# Patient Record
Sex: Male | Born: 1965 | Race: White | Hispanic: No | Marital: Married | Smoking: Former smoker
Health system: Southern US, Community
[De-identification: ages and names within clinical notes are randomized; demographics above are authoritative.]

## PROBLEM LIST (undated history)

## (undated) DIAGNOSIS — E785 Hyperlipidemia, unspecified: Secondary | ICD-10-CM

## (undated) DIAGNOSIS — N419 Inflammatory disease of prostate, unspecified: Secondary | ICD-10-CM

## (undated) HISTORY — DX: Hyperlipidemia, unspecified: E78.5

## (undated) HISTORY — DX: Inflammatory disease of prostate, unspecified: N41.9

---

## 2002-01-21 ENCOUNTER — Other Ambulatory Visit: Admission: RE | Admit: 2002-01-21 | Discharge: 2002-01-21 | Payer: Self-pay | Admitting: Dermatology

## 2007-12-21 ENCOUNTER — Encounter: Admission: RE | Admit: 2007-12-21 | Discharge: 2007-12-21 | Payer: Self-pay | Admitting: Orthopedic Surgery

## 2009-08-30 IMAGING — CR DG HAND 2V*L*
2 series · 2 of 2 positions shown · non-contrast
Comparison: None

CLINICAL DATA: Injured fourth and fifth fingers playing volleyball

LEFT HAND - 2 VIEW

[view not recorded (1 of 2)]
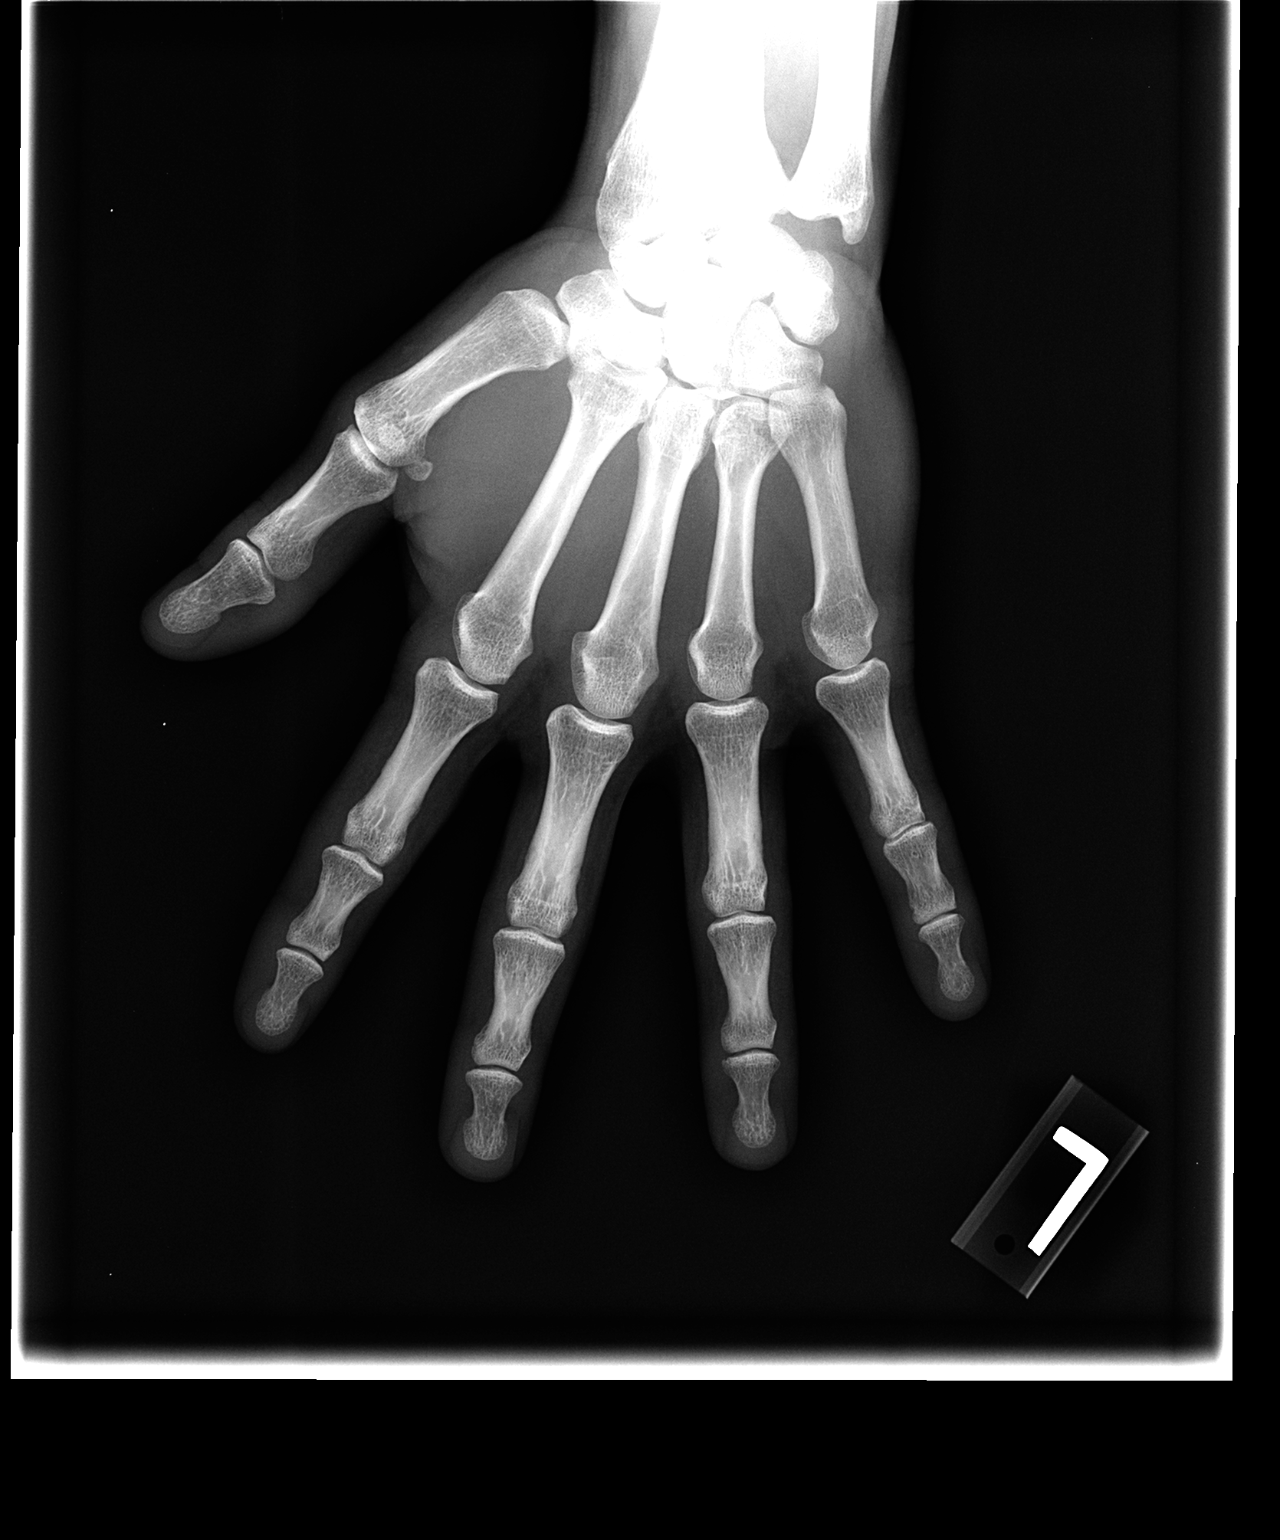

[view not recorded (2 of 2)]
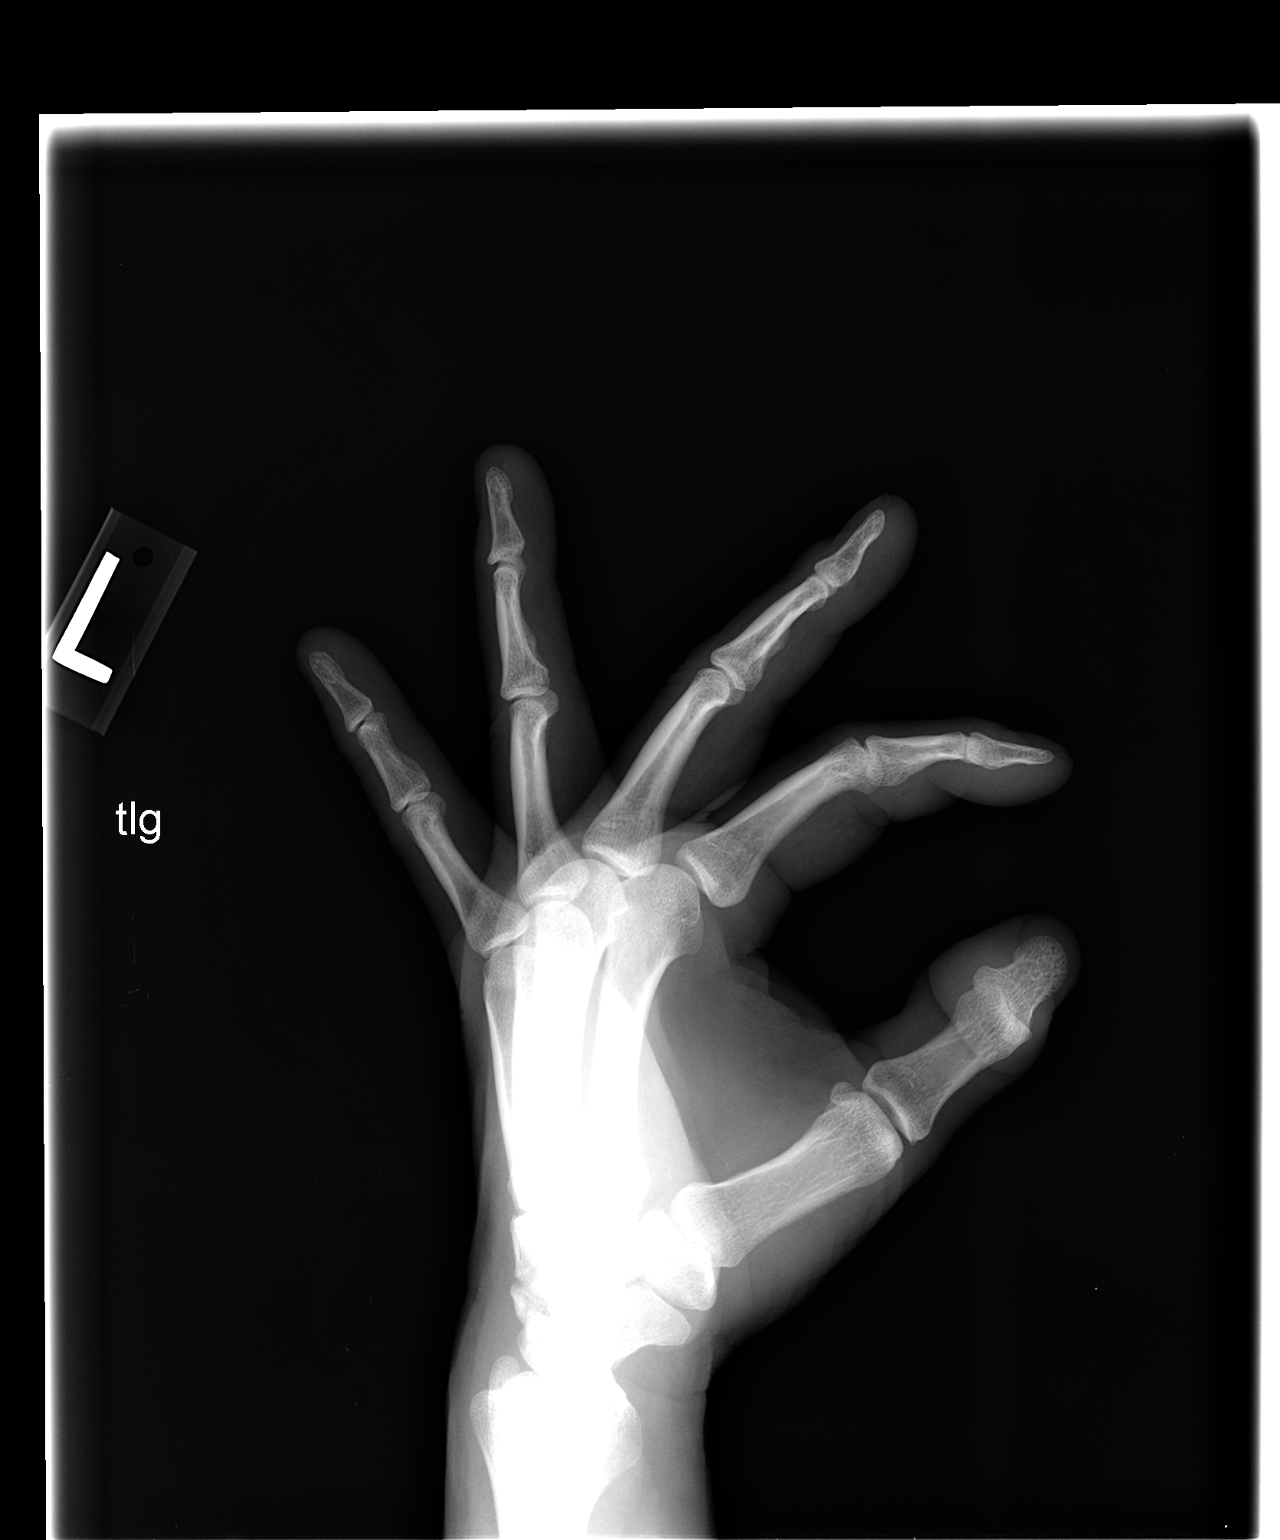

[2 of 2 positions shown; findings below may reference images not displayed]

FINDINGS: Two views of the left hand show no acute fracture.
Alignment is normal.  The radiocarpal joint space is normal and the
carpal bones are in normal position.
IMPRESSION: Negative left hand.

## 2010-11-05 ENCOUNTER — Encounter (INDEPENDENT_AMBULATORY_CARE_PROVIDER_SITE_OTHER): Payer: Managed Care, Other (non HMO)

## 2010-11-05 DIAGNOSIS — M7989 Other specified soft tissue disorders: Secondary | ICD-10-CM

## 2010-11-11 NOTE — Procedures (Unsigned)
DUPLEX DEEP VENOUS EXAM - LOWER EXTREMITY  INDICATION:  Swelling.  HISTORY:  Edema:  Yes. Trauma/Surgery:  Trauma x2 weeks ago. Pain:  Yes. PE:  No. Previous DVT:  No. Anticoagulants:  81 mg x1 a day. Other:  DUPLEX EXAM:               CFV   SFV   PopV  PTV    GSV               R  L  R  L  R  L  R   L  R  L Thrombosis    o  o     o     o      o     o Spontaneous   +  +     +     +      +     + Phasic        +  +     +     +      +     + Augmentation  +  +     +     +      +     + Compressible  +  +     +     +      +     + Competent     +  +     +     +      +     +  Legend:  + - yes  o - no  p - partial  D - decreased  IMPRESSION:  No evidence of acute deep vein thrombosis or superficial thrombophlebitis within the left lower extremity.  Subcutaneous tissue edema within the ankle.   _____________________________ Fransisco Hertz, MD  OD/MEDQ  D:  11/05/2010  T:  11/05/2010  Job:  [id number]

## 2013-04-29 ENCOUNTER — Ambulatory Visit (INDEPENDENT_AMBULATORY_CARE_PROVIDER_SITE_OTHER): Payer: BC Managed Care – PPO | Admitting: Family Medicine

## 2013-04-29 ENCOUNTER — Encounter: Payer: Self-pay | Admitting: Family Medicine

## 2013-04-29 VITALS — BP 134/89 | HR 88 | Temp 99.4°F | Ht 68.0 in | Wt 186.0 lb

## 2013-04-29 DIAGNOSIS — R350 Frequency of micturition: Secondary | ICD-10-CM

## 2013-04-29 DIAGNOSIS — N419 Inflammatory disease of prostate, unspecified: Secondary | ICD-10-CM

## 2013-04-29 LAB — POCT URINALYSIS DIPSTICK
Bilirubin, UA: NEGATIVE
Blood, UA: NEGATIVE
Glucose, UA: NEGATIVE
Ketones, UA: NEGATIVE
Leukocytes, UA: NEGATIVE
Nitrite, UA: NEGATIVE
Protein, UA: NEGATIVE
Spec Grav, UA: 1.01
Urobilinogen, UA: NEGATIVE
pH, UA: 6

## 2013-04-29 LAB — POCT UA - MICROSCOPIC ONLY
Bacteria, U Microscopic: NEGATIVE
Casts, Ur, LPF, POC: NEGATIVE
Crystals, Ur, HPF, POC: NEGATIVE
Epithelial cells, urine per micros: NEGATIVE
Mucus, UA: NEGATIVE
RBC, urine, microscopic: NEGATIVE
WBC, Ur, HPF, POC: NEGATIVE
Yeast, UA: NEGATIVE

## 2013-04-29 MED ORDER — TAMSULOSIN HCL 0.4 MG PO CAPS: 0.4000 mg | ORAL_CAPSULE | Freq: Every day | ORAL | Status: DC

## 2013-04-29 MED ORDER — SULFAMETHOXAZOLE-TMP DS 800-160 MG PO TABS: 1.0000 | ORAL_TABLET | Freq: Two times a day (BID) | ORAL | Status: DC

## 2013-04-29 NOTE — Patient Instructions (Signed)
Urinary Tract Infection  Urinary tract infections (UTIs) can develop anywhere along your urinary tract. Your urinary tract is your body's drainage system for removing wastes and extra water. Your urinary tract includes two kidneys, two ureters, a bladder, and a urethra. Your kidneys are a pair of bean-shaped organs. Each kidney is about the size of your fist. They are located below your ribs, one on each side of your spine.  CAUSES  Infections are caused by microbes, which are microscopic organisms, including fungi, viruses, and bacteria. These organisms are so small that they can only be seen through a microscope. Bacteria are the microbes that most commonly cause UTIs.  SYMPTOMS   Symptoms of UTIs may vary by age and gender of the patient and by the location of the infection. Symptoms in young women typically include a frequent and intense urge to urinate and a painful, burning feeling in the bladder or urethra during urination. Older women and men are more likely to be tired, shaky, and weak and have muscle aches and abdominal pain. A fever may mean the infection is in your kidneys. Other symptoms of a kidney infection include pain in your back or sides below the ribs, nausea, and vomiting.  DIAGNOSIS  To diagnose a UTI, your caregiver will ask you about your symptoms. Your caregiver also will ask to provide a urine sample. The urine sample will be tested for bacteria and white blood cells. White blood cells are made by your body to help fight infection.  TREATMENT   Typically, UTIs can be treated with medication. Because most UTIs are caused by a bacterial infection, they usually can be treated with the use of antibiotics. The choice of antibiotic and length of treatment depend on your symptoms and the type of bacteria causing your infection.  HOME CARE INSTRUCTIONS   If you were prescribed antibiotics, take them exactly as your caregiver instructs you. Finish the medication even if you feel better after you  have only taken some of the medication.   Drink enough water and fluids to keep your urine clear or pale yellow.   Avoid caffeine, tea, and carbonated beverages. They tend to irritate your bladder.   Empty your bladder often. Avoid holding urine for long periods of time.   Empty your bladder before and after sexual intercourse.   After a bowel movement, women should cleanse from front to back. Use each tissue only once.  SEEK MEDICAL CARE IF:    You have back pain.   You develop a fever.   Your symptoms do not begin to resolve within 3 days.  SEEK IMMEDIATE MEDICAL CARE IF:    You have severe back pain or lower abdominal pain.   You develop chills.   You have nausea or vomiting.   You have continued burning or discomfort with urination.  MAKE SURE YOU:    Understand these instructions.   Will watch your condition.   Will get help right away if you are not doing well or get worse.  Document Released: 03/09/2005 Document Revised: 11/29/2011 Document Reviewed: 07/08/2011  [employer] Patient Information 2014 [employer]

## 2013-04-29 NOTE — Progress Notes (Signed)
  Subjective:    Patient ID: BRITTON BERA, male    DOB: 1965/11/09, 47 y.o.   MRN: [id number]  HPI  This 47 y.o. male presents for evaluation of urinary frequency and discomfort when He has sex.  He notices urethral DC after BM and pain in his back and pelvic region. He has hx of prostatitis and states he has reoccurence.  Review of Systems No chest pain, SOB, HA, dizziness, vision change, N/V, diarrhea, constipation, dysuria, urinary urgency or frequency, myalgias, arthralgias or rash.     Objective:   Physical Exam  Vital signs noted  Well developed well nourished male.  HEENT - Head atraumatic Normocephalic                Eyes - PERRLA, Conjuctiva - clear Sclera- Clear EOMI                Ears - EAC's Wnl TM's Wnl Gross Hearing WNL                Nose - Nares patent                 Throat - oropharanx wnl Respiratory - Lungs CTA bilateral Cardiac - RRR S1 and S2 without murmur GI - Abdomen soft Nontender and bowel sounds active x 4 Extremities - No edema. Neuro - Grossly intact.      Assessment & Plan:  Urinary frequency - Plan: POCT urinalysis dipstick, POCT UA - Microscopic Only, sulfamethoxazole-trimethoprim (BACTRIM DS) 800-160 MG per tablet, tamsulosin (FLOMAX) 0.4 MG CAPS capsule, Urine culture  Prostatitis - Plan: Urine culture.  Bactrim DS one po bid x 30 days #60. Consider Urology referral if persists. Follow up in one month for CPE and labs.  Deatra Canter FNP

## 2013-06-10 ENCOUNTER — Ambulatory Visit: Payer: BC Managed Care – PPO | Admitting: Family Medicine

## 2013-06-18 ENCOUNTER — Encounter: Payer: Self-pay | Admitting: Family Medicine

## 2013-06-18 ENCOUNTER — Ambulatory Visit (INDEPENDENT_AMBULATORY_CARE_PROVIDER_SITE_OTHER): Payer: BC Managed Care – PPO | Admitting: Family Medicine

## 2013-06-18 VITALS — BP 131/85 | HR 74 | Temp 97.1°F | Ht 68.0 in | Wt 186.8 lb

## 2013-06-18 DIAGNOSIS — R350 Frequency of micturition: Secondary | ICD-10-CM

## 2013-06-18 DIAGNOSIS — N419 Inflammatory disease of prostate, unspecified: Secondary | ICD-10-CM

## 2013-06-18 DIAGNOSIS — Z0000 Encounter for general adult medical examination without abnormal findings: Secondary | ICD-10-CM

## 2013-06-18 DIAGNOSIS — E785 Hyperlipidemia, unspecified: Secondary | ICD-10-CM

## 2013-06-18 LAB — POCT CBC
Granulocyte percent: 56.5 %G (ref 37–80)
HCT, POC: 44.9 % (ref 43.5–53.7)
Hemoglobin: 14.8 g/dL (ref 14.1–18.1)
Lymph, poc: 2.1 (ref 0.6–3.4)
MCH, POC: 28.6 pg (ref 27–31.2)
MCHC: 32.8 g/dL (ref 31.8–35.4)
MCV: 87.1 fL (ref 80–97)
MPV: 7.9 fL (ref 0–99.8)
POC Granulocyte: 3 (ref 2–6.9)
POC LYMPH PERCENT: 39.6 %L (ref 10–50)
Platelet Count, POC: 166 10*3/uL (ref 142–424)
RBC: 5.2 M/uL (ref 4.69–6.13)
RDW, POC: 13.2 %
WBC: 5.3 10*3/uL (ref 4.6–10.2)

## 2013-06-18 LAB — POCT URINALYSIS DIPSTICK
Bilirubin, UA: NEGATIVE
Blood, UA: NEGATIVE
Glucose, UA: NEGATIVE
Ketones, UA: NEGATIVE
Leukocytes, UA: NEGATIVE
Nitrite, UA: NEGATIVE
Protein, UA: NEGATIVE
Spec Grav, UA: 1.01
Urobilinogen, UA: NEGATIVE
pH, UA: 6

## 2013-06-18 LAB — POCT UA - MICROSCOPIC ONLY
Bacteria, U Microscopic: NEGATIVE
Casts, Ur, LPF, POC: NEGATIVE
Crystals, Ur, HPF, POC: NEGATIVE
Mucus, UA: NEGATIVE
RBC, urine, microscopic: NEGATIVE
WBC, Ur, HPF, POC: NEGATIVE
Yeast, UA: NEGATIVE

## 2013-06-18 MED ORDER — SULFAMETHOXAZOLE-TMP DS 800-160 MG PO TABS: 1.0000 | ORAL_TABLET | Freq: Two times a day (BID) | ORAL | Status: AC

## 2013-06-18 NOTE — Patient Instructions (Signed)

## 2013-06-18 NOTE — Progress Notes (Signed)
   Subjective:    Patient ID: Frank Goodwin, male    DOB: [birth year], 48 y.o.   MRN: [id number]  HPI This 48 y.o. male presents for evaluation of prostatitis and nocturia.  He has hx of having  Prostatitis and just finished a month of bactrim DS and feels better but feels like the prostatitis is not Resolved. He states he has had to take 3 months of abx's for prostatitis.  He has not had CPE or labs in over a year.  He has started using OTC herbal medicine for the prostate and states this has helped.   Review of Systems C/o nocturia and urinary frequency. No chest pain, SOB, HA, dizziness, vision change, N/V, diarrhea, constipation,  myalgias, arthralgias or rash.     Objective:   Physical Exam Vital signs noted  Well developed well nourished male.  HEENT - Head atraumatic Normocephalic                Eyes - PERRLA, Conjuctiva - clear Sclera- Clear EOMI                Ears - EAC's Wnl TM's Wnl Gross Hearing WNL                Nose - Nares patent                 Throat - oropharanx wnl Respiratory - Lungs CTA bilateral Cardiac - RRR S1 and S2 without murmur GI - Abdomen soft Nontender and bowel sounds active x 4 Extremities - No edema. Neuro - Grossly intact.   Results for orders placed in visit on 06/18/13  POCT URINALYSIS DIPSTICK      Result Value Range   Color, UA yellow     Clarity, UA clear     Glucose, UA neg     Bilirubin, UA neg     Ketones, UA neg     Spec Grav, UA 1.010     Blood, UA neg     pH, UA 6.0     Protein, UA neg     Urobilinogen, UA negative     Nitrite, UA neg     Leukocytes, UA Negative    POCT UA - MICROSCOPIC ONLY      Result Value Range   WBC, Ur, HPF, POC neg     RBC, urine, microscopic neg     Bacteria, U Microscopic neg     Mucus, UA neg     Epithelial cells, urine per micros occ     Crystals, Ur, HPF, POC neg     Casts, Ur, LPF, POC neg     Yeast, UA neg        Assessment & Plan:  Prostatitis - Plan: POCT urinalysis  dipstick, POCT UA - Microscopic Only, Urine culture  Urinary frequency - Plan: sulfamethoxazole-trimethoprim (BACTRIM DS) 800-160 MG per tablet  Other and unspecified hyperlipidemia - Plan: Lipid panel  Routine general medical examination at a health care facility - Plan: POCT CBC, CMP14+EGFR, Thyroid Panel With TSH, Vit D  25 hydroxy (rtn osteoporosis monitoring), Lipid panel  Lysbeth Penner FNP

## 2013-06-19 LAB — CMP14+EGFR
ALT: 47 IU/L — ABNORMAL HIGH (ref 0–44)
AST: 35 IU/L (ref 0–40)
Albumin/Globulin Ratio: 2.8 — ABNORMAL HIGH (ref 1.1–2.5)
Albumin: 5.1 g/dL (ref 3.5–5.5)
Alkaline Phosphatase: 50 IU/L (ref 39–117)
BUN/Creatinine Ratio: 11 (ref 9–20)
BUN: 12 mg/dL (ref 6–24)
CO2: 19 mmol/L (ref 18–29)
Calcium: 10.1 mg/dL (ref 8.7–10.2)
Chloride: 102 mmol/L (ref 97–108)
Creatinine, Ser: 1.06 mg/dL (ref 0.76–1.27)
GFR calc Af Amer: 97 mL/min/{1.73_m2} (ref >59)
GFR calc non Af Amer: 84 mL/min/{1.73_m2} (ref >59)
Globulin, Total: 1.8 g/dL (ref 1.5–4.5)
Glucose: 88 mg/dL (ref 65–99)
Potassium: 4.5 mmol/L (ref 3.5–5.2)
Sodium: 141 mmol/L (ref 134–144)
Total Bilirubin: 0.5 mg/dL (ref 0.0–1.2)
Total Protein: 6.9 g/dL (ref 6.0–8.5)

## 2013-06-19 LAB — THYROID PANEL WITH TSH
Free Thyroxine Index: 1.9 (ref 1.2–4.9)
T3 Uptake Ratio: 26 % (ref 24–39)
T4, Total: 7.2 ug/dL (ref 4.5–12.0)
TSH: 2.94 u[IU]/mL (ref 0.450–4.500)

## 2013-06-19 LAB — LIPID PANEL
Chol/HDL Ratio: 5.8 ratio units — ABNORMAL HIGH (ref 0.0–5.0)
Cholesterol, Total: 180 mg/dL (ref 100–199)
HDL: 31 mg/dL — ABNORMAL LOW (ref >39)
LDL Calculated: 118 mg/dL — ABNORMAL HIGH (ref 0–99)
Triglycerides: 153 mg/dL — ABNORMAL HIGH (ref 0–149)
VLDL Cholesterol Cal: 31 mg/dL (ref 5–40)

## 2013-06-19 LAB — VITAMIN D 25 HYDROXY (VIT D DEFICIENCY, FRACTURES): Vit D, 25-Hydroxy: 21.8 ng/mL — ABNORMAL LOW (ref 30.0–100.0)

## 2013-06-20 LAB — URINE CULTURE: Organism ID, Bacteria: NO GROWTH

## 2013-07-30 ENCOUNTER — Other Ambulatory Visit: Payer: Self-pay | Admitting: Family Medicine
